# Patient Record
Sex: Female | Born: 1945 | Race: White | Hispanic: No | Smoking: Never smoker
Health system: Southern US, Community
[De-identification: ages and names within clinical notes are randomized; demographics above are authoritative.]

---

## 2008-05-08 ENCOUNTER — Emergency Department (HOSPITAL_COMMUNITY): Admission: EM | Admit: 2008-05-08 | Discharge: 2008-05-08 | Payer: Self-pay | Admitting: Emergency Medicine

## 2009-02-15 ENCOUNTER — Emergency Department (HOSPITAL_COMMUNITY): Admission: EM | Admit: 2009-02-15 | Discharge: 2009-02-15 | Payer: Self-pay | Admitting: Family Medicine

## 2009-05-10 ENCOUNTER — Emergency Department (HOSPITAL_COMMUNITY): Admission: EM | Admit: 2009-05-10 | Discharge: 2009-05-10 | Payer: Self-pay | Admitting: Emergency Medicine

## 2010-03-09 ENCOUNTER — Encounter: Admission: RE | Admit: 2010-03-09 | Discharge: 2010-03-09 | Payer: Self-pay | Admitting: Orthopedic Surgery

## 2011-01-08 LAB — POCT URINALYSIS DIP (DEVICE): Urobilinogen, UA: 0.2 mg/dL (ref 0.0–1.0)

## 2011-01-11 LAB — POCT URINALYSIS DIP (DEVICE)
Ketones, ur: NEGATIVE mg/dL
Protein, ur: NEGATIVE mg/dL
Specific Gravity, Urine: <=1.005 (ref 1.005–1.030)
Urobilinogen, UA: 0.2 mg/dL (ref 0.0–1.0)
pH: 7 (ref 5.0–8.0)

## 2011-07-01 LAB — POCT URINALYSIS DIP (DEVICE)
Bilirubin Urine: NEGATIVE
Glucose, UA: NEGATIVE
Ketones, ur: NEGATIVE
Nitrite: NEGATIVE
Operator id: 239701
Protein, ur: NEGATIVE
Specific Gravity, Urine: 1.01
Urobilinogen, UA: 0.2

## 2011-07-01 LAB — URINE CULTURE: Colony Count: >=100000

## 2015-04-13 DIAGNOSIS — S91312A Laceration without foreign body, left foot, initial encounter: Secondary | ICD-10-CM | POA: Diagnosis not present

## 2015-04-20 ENCOUNTER — Ambulatory Visit
Admission: RE | Admit: 2015-04-20 | Discharge: 2015-04-20 | Disposition: A | Discharge status: Home / Self Care | Payer: Medicare PPO | Source: Ambulatory Visit | Attending: Internal Medicine | Admitting: Internal Medicine

## 2015-04-20 ENCOUNTER — Other Ambulatory Visit: Payer: Self-pay | Admitting: Internal Medicine

## 2015-04-20 DIAGNOSIS — Z9181 History of falling: Secondary | ICD-10-CM | POA: Diagnosis not present

## 2015-04-20 DIAGNOSIS — R0789 Other chest pain: Secondary | ICD-10-CM

## 2015-04-20 DIAGNOSIS — T148 Other injury of unspecified body region: Secondary | ICD-10-CM | POA: Diagnosis not present

## 2015-04-20 DIAGNOSIS — R079 Chest pain, unspecified: Secondary | ICD-10-CM | POA: Diagnosis not present

## 2015-04-20 DIAGNOSIS — S8392XS Sprain of unspecified site of left knee, sequela: Secondary | ICD-10-CM | POA: Diagnosis not present

## 2015-04-20 DIAGNOSIS — S299XXA Unspecified injury of thorax, initial encounter: Secondary | ICD-10-CM | POA: Diagnosis not present

## 2015-04-27 DIAGNOSIS — S91312D Laceration without foreign body, left foot, subsequent encounter: Secondary | ICD-10-CM | POA: Diagnosis not present

## 2015-04-27 DIAGNOSIS — S93492A Sprain of other ligament of left ankle, initial encounter: Secondary | ICD-10-CM | POA: Diagnosis not present

## 2015-05-05 DIAGNOSIS — H2513 Age-related nuclear cataract, bilateral: Secondary | ICD-10-CM | POA: Diagnosis not present

## 2015-05-05 DIAGNOSIS — H3561 Retinal hemorrhage, right eye: Secondary | ICD-10-CM | POA: Diagnosis not present

## 2015-05-05 DIAGNOSIS — H538 Other visual disturbances: Secondary | ICD-10-CM | POA: Diagnosis not present

## 2015-05-26 DIAGNOSIS — H538 Other visual disturbances: Secondary | ICD-10-CM | POA: Diagnosis not present

## 2015-05-26 DIAGNOSIS — H3561 Retinal hemorrhage, right eye: Secondary | ICD-10-CM | POA: Diagnosis not present

## 2015-05-26 DIAGNOSIS — H2513 Age-related nuclear cataract, bilateral: Secondary | ICD-10-CM | POA: Diagnosis not present

## 2015-07-06 DIAGNOSIS — L821 Other seborrheic keratosis: Secondary | ICD-10-CM | POA: Diagnosis not present

## 2015-07-06 DIAGNOSIS — D225 Melanocytic nevi of trunk: Secondary | ICD-10-CM | POA: Diagnosis not present

## 2015-07-06 DIAGNOSIS — Z85828 Personal history of other malignant neoplasm of skin: Secondary | ICD-10-CM | POA: Diagnosis not present

## 2015-07-14 DIAGNOSIS — R636 Underweight: Secondary | ICD-10-CM | POA: Diagnosis not present

## 2015-07-14 DIAGNOSIS — Z0000 Encounter for general adult medical examination without abnormal findings: Secondary | ICD-10-CM | POA: Diagnosis not present

## 2015-07-14 DIAGNOSIS — Z1389 Encounter for screening for other disorder: Secondary | ICD-10-CM | POA: Diagnosis not present

## 2016-08-12 ENCOUNTER — Other Ambulatory Visit: Payer: Self-pay | Admitting: Internal Medicine

## 2016-08-12 ENCOUNTER — Ambulatory Visit
Admission: RE | Admit: 2016-08-12 | Discharge: 2016-08-12 | Disposition: A | Discharge status: Home / Self Care | Payer: Medicare PPO | Source: Ambulatory Visit | Attending: Internal Medicine | Admitting: Internal Medicine

## 2016-08-12 DIAGNOSIS — M1712 Unilateral primary osteoarthritis, left knee: Secondary | ICD-10-CM

## 2020-02-19 ENCOUNTER — Other Ambulatory Visit: Payer: Self-pay

## 2020-02-19 ENCOUNTER — Ambulatory Visit: Payer: Medicare PPO | Admitting: Cardiovascular Disease

## 2020-02-19 ENCOUNTER — Encounter: Payer: Self-pay | Admitting: Cardiovascular Disease

## 2020-02-19 DIAGNOSIS — R0600 Dyspnea, unspecified: Secondary | ICD-10-CM | POA: Diagnosis not present

## 2020-02-19 DIAGNOSIS — R9431 Abnormal electrocardiogram [ECG] [EKG]: Secondary | ICD-10-CM | POA: Diagnosis not present

## 2020-02-19 DIAGNOSIS — R0989 Other specified symptoms and signs involving the circulatory and respiratory systems: Secondary | ICD-10-CM

## 2020-02-19 DIAGNOSIS — R0609 Other forms of dyspnea: Secondary | ICD-10-CM | POA: Insufficient documentation

## 2020-02-19 DIAGNOSIS — I4510 Unspecified right bundle-branch block: Secondary | ICD-10-CM | POA: Diagnosis not present

## 2020-02-19 NOTE — Progress Notes (Signed)
02/19/2020 Glenda Black   [birth year]  [id number]  Primary Physician Leeroy Cha, MD Primary Cardiologist: Lorretta Harp MD FACP, [place], [place], [place]  HPI:  Glenda Black is a 74 y.o. is a thin and fit appearing widowed (unfortunately has been a 5 years died in 11-11-22 of this year) Caucasian female mother of 3 children (2 daughters, 1 son, grandmother of 2 grandchildren referred by Dr.Varadarajan for cardiovascular dilation because of the abnormal EKG and dyspnea.  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  She has no cardiac risk factors.  She is never had a heart attack or stroke.  She denies chest pain.  She is noticed new onset dyspnea over the last several months.  She is fairly active and walks 20 minutes a day in the mornings.  She does have a right bundle branch block.  Her most recent lipid profile performed 07/19/2017 reveals a cholesterol 170 with an LDL of 91.   Current Meds  Medication Sig  . albuterol (VENTOLIN HFA) 108 (90 Base) MCG/ACT inhaler   . metroNIDAZOLE (METROCREAM) 0.75 % cream      Allergies  Allergen Reactions  . Latex Rash  . Tetracycline Rash    Social History   Socioeconomic History  . Marital status: Married    Spouse name: Not on file  . Number of children: Not on file  . Years of education: Not on file  . Highest education level: Not on file  Occupational History  . Not on file  Tobacco Use  . Smoking status: Never Smoker  . Smokeless tobacco: Never Used  Substance and Sexual Activity  . Alcohol use: Never  . Drug use: Never  . Sexual activity: Not on file  Other Topics Concern  . Not on file  Social History Narrative  . Not on file   Social Determinants of Health   Financial Resource Strain:   . Difficulty of Paying Living Expenses:   Food Insecurity:   . Worried About Charity fundraiser in the Last Year:   . Arboriculturist in the Last Year:    Transportation Needs:   . Film/video editor (Medical):   Marland Kitchen Lack of Transportation (Non-Medical):   Physical Activity:   . Days of Exercise per Week:   . Minutes of Exercise per Session:   Stress:   . Feeling of Stress :   Social Connections:   . Frequency of Communication with Friends and Family:   . Frequency of Social Gatherings with Friends and Family:   . Attends Religious Services:   . Active Member of Clubs or Organizations:   . Attends Archivist Meetings:   Marland Kitchen Marital Status:   Intimate Partner Violence:   . Fear of Current or Ex-Partner:   . Emotionally Abused:   Marland Kitchen Physically Abused:   . Sexually Abused:      Review of Systems: General: negative for chills, fever, night sweats or weight changes.  Cardiovascular: negative for chest pain, dyspnea on exertion, edema, orthopnea, palpitations, paroxysmal nocturnal dyspnea or shortness of breath Dermatological: negative for rash Respiratory: negative for cough or wheezing Urologic: negative for hematuria Abdominal: negative for nausea, vomiting, diarrhea, bright red blood per rectum, melena, or hematemesis Neurologic: negative for visual changes, syncope, or dizziness All other systems reviewed and are otherwise negative except as noted above.    Blood pressure 114/72, pulse 82, height 5\' 8"  (1.727 m), weight 135  lb 9.6 oz (61.5 kg), SpO2 98 %.  General appearance: alert and no distress Neck: no adenopathy, no JVD, supple, symmetrical, trachea midline, thyroid not enlarged, symmetric, no tenderness/mass/nodules and Soft left carotid bruit Lungs: clear to auscultation bilaterally Heart: regular rate and rhythm, S1, S2 normal, no murmur, click, rub or gallop Extremities: extremities normal, atraumatic, no cyanosis or edema Pulses: 2+ and symmetric Skin: Skin color, texture, turgor normal. No rashes or lesions Neurologic: Alert and oriented X 3, normal strength and tone. Normal symmetric reflexes. Normal  coordination and gait  EKG sinus rhythm at 82 with right bundle branch block.  I personally reviewed this EKG.  ASSESSMENT AND PLAN:   Right bundle branch block Unknown duration  Dyspnea on exertion Fairly new onset dyspnea on exertion.  Patient has no history of tobacco abuse or exposure to lung toxins.  She denies chest pain.  I am going to get a coronary calcium score and a 2D echo to further evaluate  Left carotid bruit Left carotid bruit on physical exam today.  Will check carotid Dopplers to further evaluate      Runell Gess MD [facility], [facility] 02/19/2020 10:50 AM

## 2020-02-19 NOTE — Assessment & Plan Note (Signed)
Unknown duration 

## 2020-02-19 NOTE — Assessment & Plan Note (Signed)
Fairly new onset dyspnea on exertion.  Patient has no history of tobacco abuse or exposure to lung toxins.  She denies chest pain.  I am going to get a coronary calcium score and a 2D echo to further evaluate

## 2020-02-19 NOTE — Patient Instructions (Signed)
Medication Instructions:  The current medical regimen is effective;  continue present plan and medications.  *If you need a refill on your cardiac medications before your next appointment, please call your pharmacy*   Testing/Procedures: Your physician has requested that you have a carotid duplex. This test is an ultrasound of the carotid arteries in your neck. It looks at blood flow through these arteries that supply the brain with blood. Allow one hour for this exam. There are no restrictions or special instructions.  Echocardiogram - Your physician has requested that you have an echocardiogram. Echocardiography is a painless test that uses sound waves to create images of your heart. It provides your doctor with information about the size and shape of your heart and how well your heart's chambers and valves are working. This procedure takes approximately one hour. There are no restrictions for this procedure. This will be performed at our [facility] location - [address].  CALCIUM SCORE   Follow-Up: At [facility], you and your health needs are our priority.  As part of our continuing mission to provide you with exceptional heart care, we have created designated Provider Care Teams.  These Care Teams include your primary Cardiologist (physician) and Advanced Practice Providers (APPs -  Physician Assistants and Nurse Practitioners) who all work together to provide you with the care you need, when you need it.  We recommend signing up for the patient portal called "MyChart".  Sign up information is provided on this After Visit Summary.  MyChart is used to connect with patients for Virtual Visits (Telemedicine).  Patients are able to view lab/test results, encounter notes, upcoming appointments, etc.  Non-urgent messages can be sent to your provider as well.   To learn more about what you can do with MyChart, go to [email or web address].    Your next appointment:   4  week(s)  The format for your next appointment:   In Person  Provider:   Nanetta Batty, MD

## 2020-02-19 NOTE — Assessment & Plan Note (Signed)
Left carotid bruit on physical exam today.  Will check carotid Dopplers to further evaluate

## 2020-02-21 ENCOUNTER — Other Ambulatory Visit: Payer: Self-pay

## 2020-02-21 ENCOUNTER — Ambulatory Visit (HOSPITAL_COMMUNITY)
Admission: RE | Admit: 2020-02-21 | Discharge: 2020-02-21 | Disposition: A | Discharge status: Home / Self Care | Payer: Medicare PPO | Source: Ambulatory Visit | Attending: Internal Medicine | Admitting: Internal Medicine

## 2020-02-21 DIAGNOSIS — R0989 Other specified symptoms and signs involving the circulatory and respiratory systems: Secondary | ICD-10-CM | POA: Diagnosis not present

## 2020-03-13 ENCOUNTER — Ambulatory Visit (INDEPENDENT_AMBULATORY_CARE_PROVIDER_SITE_OTHER)
Admission: RE | Admit: 2020-03-13 | Discharge: 2020-03-13 | Disposition: A | Discharge status: Home / Self Care | Payer: Medicare PPO | Source: Ambulatory Visit | Attending: Cardiovascular Disease | Admitting: Cardiovascular Disease

## 2020-03-13 ENCOUNTER — Ambulatory Visit (HOSPITAL_COMMUNITY): Payer: Medicare PPO | Attending: Cardiology

## 2020-03-13 ENCOUNTER — Other Ambulatory Visit: Payer: Self-pay

## 2020-03-13 DIAGNOSIS — R0609 Other forms of dyspnea: Secondary | ICD-10-CM

## 2020-03-13 DIAGNOSIS — R0600 Dyspnea, unspecified: Secondary | ICD-10-CM

## 2020-03-13 DIAGNOSIS — R9431 Abnormal electrocardiogram [ECG] [EKG]: Secondary | ICD-10-CM | POA: Diagnosis present

## 2020-03-17 ENCOUNTER — Telehealth: Payer: Self-pay | Admitting: Cardiovascular Disease

## 2020-03-17 NOTE — Telephone Encounter (Signed)
LM that results were sent to mychart previously. OV 03/24/20 to review

## 2020-03-17 NOTE — Telephone Encounter (Signed)
° ° °  Pt returning call to get her CT and echo results.

## 2020-03-19 ENCOUNTER — Telehealth: Payer: Self-pay | Admitting: Cardiovascular Disease

## 2020-03-19 NOTE — Telephone Encounter (Signed)
Patient states that her results for her CT and echo came back normal. She wants to know if the appt on 03/24/20 with Dr. Allyson Sabal is necessary

## 2020-03-19 NOTE — Telephone Encounter (Signed)
Patient's CT calcium score, echo, carotid were WNL. No additional work up was advised on test results. Patient wishes to cancel 6/22 visit.

## 2020-03-24 ENCOUNTER — Ambulatory Visit: Payer: Medicare PPO | Admitting: Cardiovascular Disease

## 2020-04-16 DIAGNOSIS — L719 Rosacea, unspecified: Secondary | ICD-10-CM | POA: Diagnosis not present

## 2020-04-16 DIAGNOSIS — D0439 Carcinoma in situ of skin of other parts of face: Secondary | ICD-10-CM | POA: Diagnosis not present

## 2020-05-06 DIAGNOSIS — L570 Actinic keratosis: Secondary | ICD-10-CM | POA: Diagnosis not present

## 2020-05-06 DIAGNOSIS — Z08 Encounter for follow-up examination after completed treatment for malignant neoplasm: Secondary | ICD-10-CM | POA: Diagnosis not present

## 2020-05-06 DIAGNOSIS — L718 Other rosacea: Secondary | ICD-10-CM | POA: Diagnosis not present

## 2020-05-06 DIAGNOSIS — X32XXXA Exposure to sunlight, initial encounter: Secondary | ICD-10-CM | POA: Diagnosis not present

## 2020-05-06 DIAGNOSIS — Z85828 Personal history of other malignant neoplasm of skin: Secondary | ICD-10-CM | POA: Diagnosis not present

## 2020-05-07 DIAGNOSIS — J302 Other seasonal allergic rhinitis: Secondary | ICD-10-CM | POA: Diagnosis not present

## 2020-05-07 DIAGNOSIS — G43109 Migraine with aura, not intractable, without status migrainosus: Secondary | ICD-10-CM | POA: Diagnosis not present

## 2020-05-07 DIAGNOSIS — B181 Chronic viral hepatitis B without delta-agent: Secondary | ICD-10-CM | POA: Diagnosis not present

## 2020-07-09 DIAGNOSIS — Z23 Encounter for immunization: Secondary | ICD-10-CM | POA: Diagnosis not present

## 2020-07-18 ENCOUNTER — Ambulatory Visit: Payer: Medicare PPO

## 2020-08-01 ENCOUNTER — Other Ambulatory Visit: Payer: Self-pay

## 2020-08-01 ENCOUNTER — Ambulatory Visit: Payer: Medicare PPO | Attending: Internal Medicine

## 2020-08-01 DIAGNOSIS — Z23 Encounter for immunization: Secondary | ICD-10-CM

## 2020-08-01 NOTE — Progress Notes (Signed)
° °  Covid-19 Vaccination Clinic  Name:  Glenda Black    MRN: [id number] DOB: Jan 24, 1946  08/01/2020  [name] was observed post Covid-19 immunization for 15 minutes without incident. She was provided with Vaccine Information Sheet and instruction to access the V-Safe system.   [name] was instructed to call 911 with any severe reactions post vaccine:  Difficulty breathing   Swelling of face and throat   A fast heartbeat   A bad rash all over body   Dizziness and weakness

## 2020-08-20 DIAGNOSIS — L578 Other skin changes due to chronic exposure to nonionizing radiation: Secondary | ICD-10-CM | POA: Diagnosis not present

## 2020-08-20 DIAGNOSIS — L814 Other melanin hyperpigmentation: Secondary | ICD-10-CM | POA: Diagnosis not present

## 2020-08-20 DIAGNOSIS — L905 Scar conditions and fibrosis of skin: Secondary | ICD-10-CM | POA: Diagnosis not present

## 2020-08-20 DIAGNOSIS — L570 Actinic keratosis: Secondary | ICD-10-CM | POA: Diagnosis not present

## 2020-08-20 DIAGNOSIS — L821 Other seborrheic keratosis: Secondary | ICD-10-CM | POA: Diagnosis not present

## 2020-08-20 DIAGNOSIS — Z85828 Personal history of other malignant neoplasm of skin: Secondary | ICD-10-CM | POA: Diagnosis not present

## 2020-08-20 DIAGNOSIS — D225 Melanocytic nevi of trunk: Secondary | ICD-10-CM | POA: Diagnosis not present

## 2020-10-05 DIAGNOSIS — L820 Inflamed seborrheic keratosis: Secondary | ICD-10-CM | POA: Diagnosis not present

## 2020-10-05 DIAGNOSIS — D485 Neoplasm of uncertain behavior of skin: Secondary | ICD-10-CM | POA: Diagnosis not present

## 2020-10-05 DIAGNOSIS — L821 Other seborrheic keratosis: Secondary | ICD-10-CM | POA: Diagnosis not present

## 2020-10-14 IMAGING — CT CT CARDIAC CORONARY ARTERY CALCIUM SCORE
3 series · 14 of 20 positions shown, 15 images · non-contrast
Comparison: None.

Addendum:
CLINICAL DATA: Risk stratification

EXAM:
Coronary Calcium Score
TECHNIQUE: The patient was scanned on a Siemens Force scanner. Axial
non-contrast 3 mm slices were carried out through the heart. The
data set was analyzed on a dedicated work station and scored using
the Agatson method.

[Series 2: casc 3.0 bv41 2 bestdiast 73 % · axial · 0.31mm/px · z∈[-210,-120]mm · 4 of 52 slices shown, 5 images]
[im 11/52  vessel]
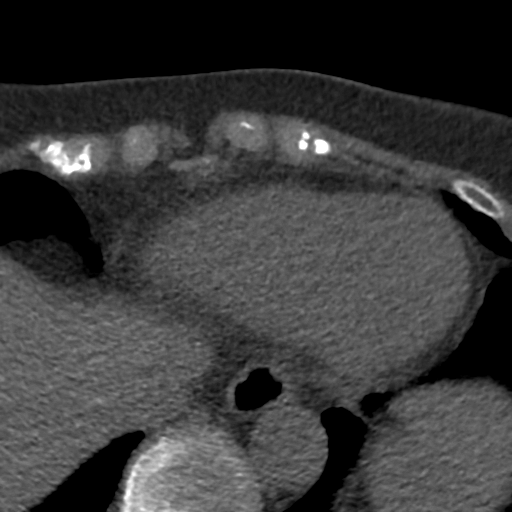
[im 11/52  lung]
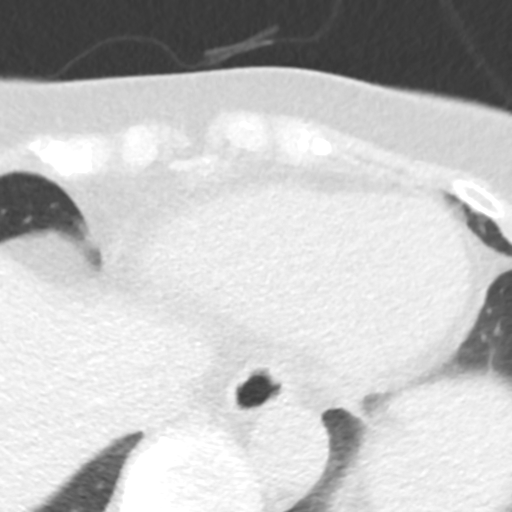
[im 21/52  vessel]
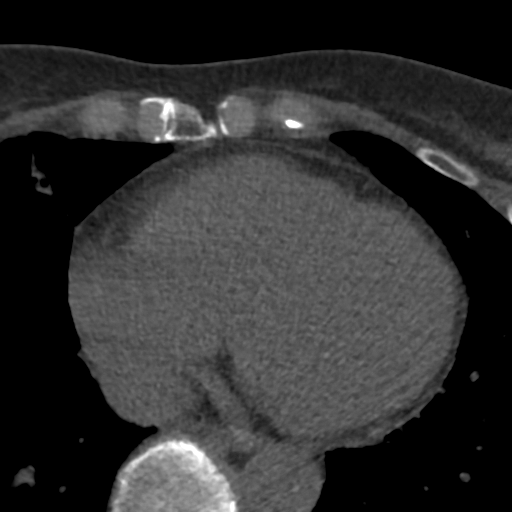
[im 31/52  vessel]
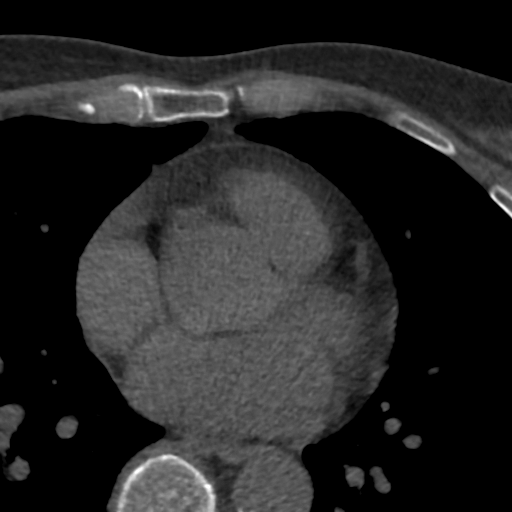
[im 41/52  vessel]
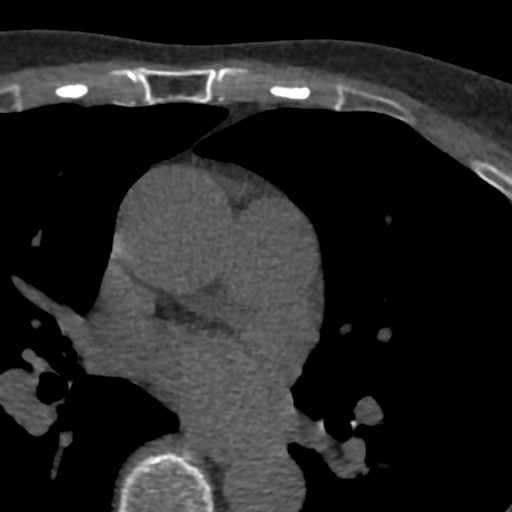

[Series 3: lung 73 % · axial · 0.68mm/px · z∈[-216,-114]mm · 5 of 52 slices shown]
[im 9/52  lung]
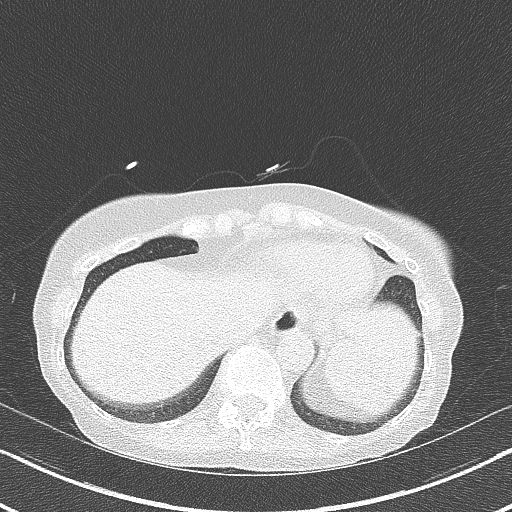
[im 18/52  lung]
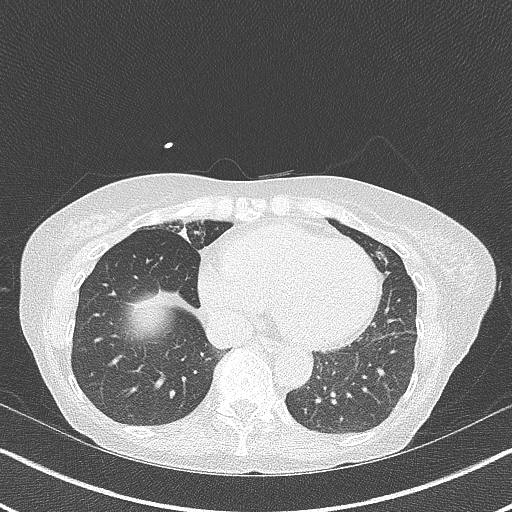
[im 26/52  lung]
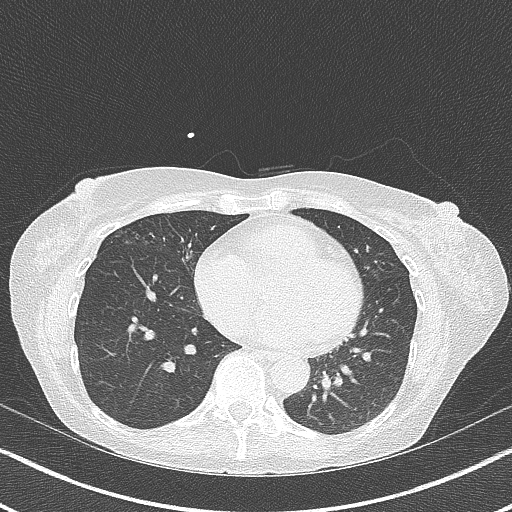
[im 35/52  lung]
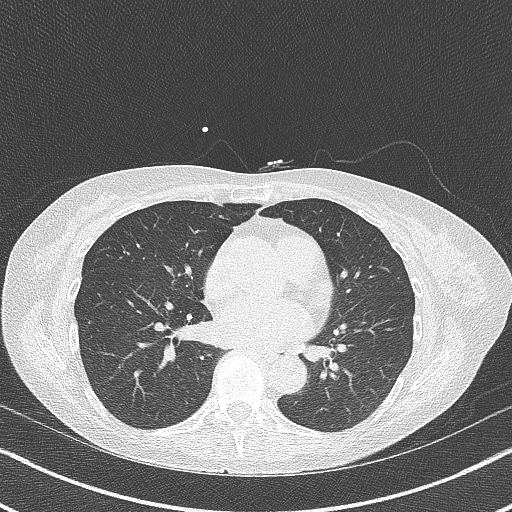
[im 43/52  lung]
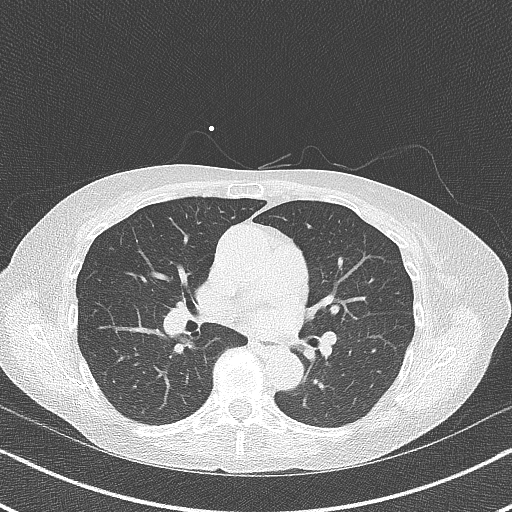

[Series 4: lung st 73 % · axial · 0.68mm/px · z∈[-216,-114]mm · 5 of 52 slices shown]
[im 9/52  lung]
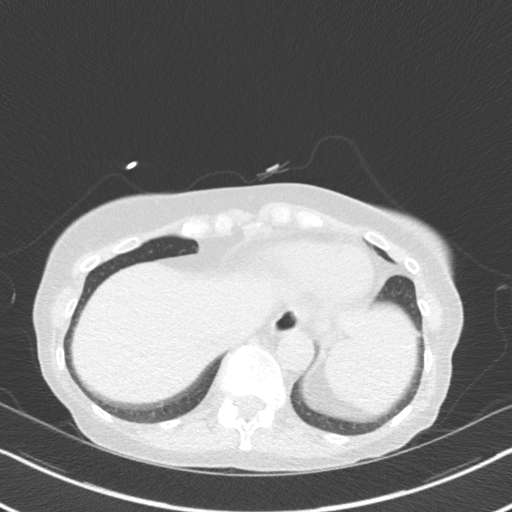
[im 18/52  lung]
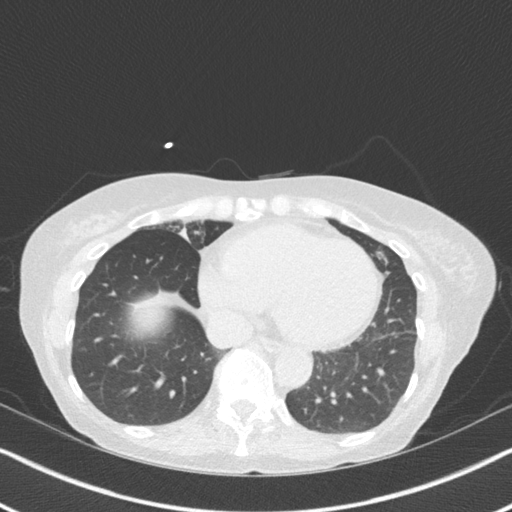
[im 26/52  lung]
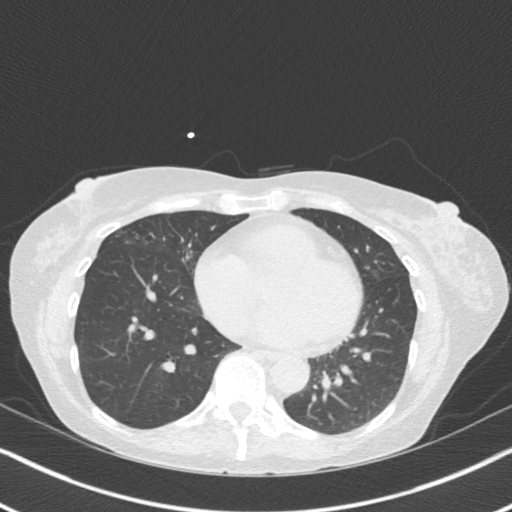
[im 35/52  lung]
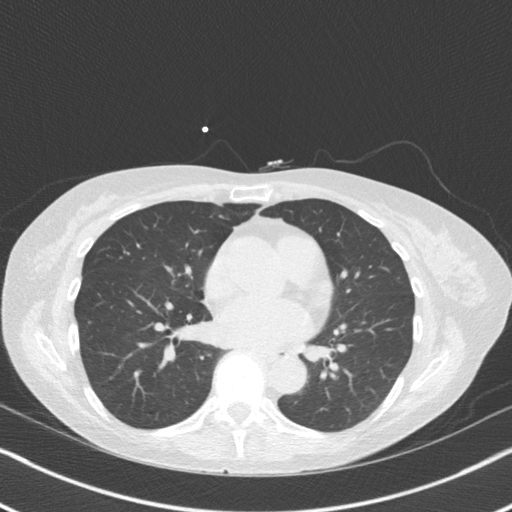
[im 43/52  lung]
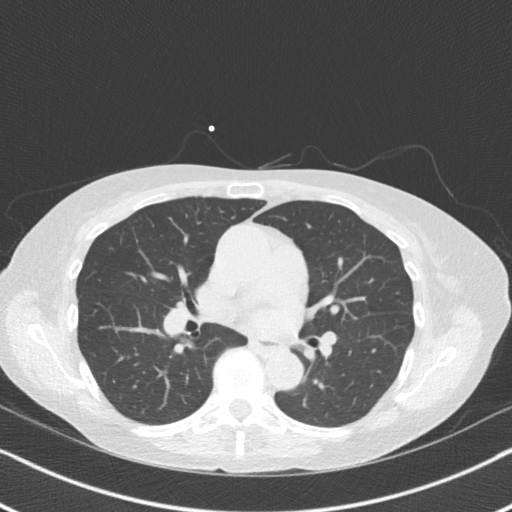

[14 of 20 positions shown; findings below may reference images not displayed]

FINDINGS: Non-cardiac: See separate report from [REDACTED].

Ascending Aorta: Normal caliber.  Aortic atherosclerosis.

Pericardium: Normal

Coronary arteries: Normal origins
IMPRESSION: Coronary calcium score of 0.

Aortic atherosclerosis.

EXAM:
OVER-READ INTERPRETATION  CT CHEST

The following report is an over-read performed by radiologist Dr.
over-read does not include interpretation of cardiac or coronary
anatomy or pathology. The coronary calcium score interpretation by
the cardiologist is attached.
FINDINGS: Aortic atherosclerosis. In the right middle lobe there is some mild
cylindrical bronchiectasis, thickening of the peribronchovascular
interstitium with regional architectural distortion and some
peribronchovascular micronodularity most compatible with areas of
mucoid impaction within terminal bronchioles. Within the visualized
portions of the thorax there are no this other larger more
suspicious appearing pulmonary nodules or masses, there is no acute
consolidative airspace disease, no pleural effusions, no
pneumothorax and no lymphadenopathy. Visualized portions of the
upper abdomen are unremarkable. There are no aggressive appearing
lytic or blastic lesions noted in the visualized portions of the
skeleton.
IMPRESSION: 1.  Aortic Atherosclerosis ([id number]).
2. Chronic post infectious changes in the right middle lobe, as
detailed above.

*** End of Addendum ***
FINDINGS: Non-cardiac: See separate report from [REDACTED].

Ascending Aorta: Normal caliber.  Aortic atherosclerosis.

Pericardium: Normal

Coronary arteries: Normal origins
IMPRESSION: Coronary calcium score of 0.

Aortic atherosclerosis.

## 2020-12-08 DIAGNOSIS — Z1231 Encounter for screening mammogram for malignant neoplasm of breast: Secondary | ICD-10-CM | POA: Diagnosis not present

## 2021-01-05 DIAGNOSIS — L570 Actinic keratosis: Secondary | ICD-10-CM | POA: Diagnosis not present

## 2021-02-03 DIAGNOSIS — Z136 Encounter for screening for cardiovascular disorders: Secondary | ICD-10-CM | POA: Diagnosis not present

## 2021-02-03 DIAGNOSIS — M810 Age-related osteoporosis without current pathological fracture: Secondary | ICD-10-CM | POA: Diagnosis not present

## 2021-02-03 DIAGNOSIS — Z1211 Encounter for screening for malignant neoplasm of colon: Secondary | ICD-10-CM | POA: Diagnosis not present

## 2021-02-03 DIAGNOSIS — Z0000 Encounter for general adult medical examination without abnormal findings: Secondary | ICD-10-CM | POA: Diagnosis not present

## 2021-02-03 DIAGNOSIS — F4323 Adjustment disorder with mixed anxiety and depressed mood: Secondary | ICD-10-CM | POA: Diagnosis not present

## 2021-02-03 DIAGNOSIS — B181 Chronic viral hepatitis B without delta-agent: Secondary | ICD-10-CM | POA: Diagnosis not present

## 2021-02-03 DIAGNOSIS — J302 Other seasonal allergic rhinitis: Secondary | ICD-10-CM | POA: Diagnosis not present

## 2021-02-03 DIAGNOSIS — M170 Bilateral primary osteoarthritis of knee: Secondary | ICD-10-CM | POA: Diagnosis not present

## 2021-02-03 DIAGNOSIS — G43109 Migraine with aura, not intractable, without status migrainosus: Secondary | ICD-10-CM | POA: Diagnosis not present

## 2021-02-03 DIAGNOSIS — Z1389 Encounter for screening for other disorder: Secondary | ICD-10-CM | POA: Diagnosis not present

## 2021-02-03 DIAGNOSIS — Z1231 Encounter for screening mammogram for malignant neoplasm of breast: Secondary | ICD-10-CM | POA: Diagnosis not present

## 2021-02-25 DIAGNOSIS — Z1211 Encounter for screening for malignant neoplasm of colon: Secondary | ICD-10-CM | POA: Diagnosis not present

## 2021-02-25 DIAGNOSIS — Z1212 Encounter for screening for malignant neoplasm of rectum: Secondary | ICD-10-CM | POA: Diagnosis not present

## 2021-03-04 DIAGNOSIS — M722 Plantar fascial fibromatosis: Secondary | ICD-10-CM | POA: Diagnosis not present

## 2021-03-04 DIAGNOSIS — M79671 Pain in right foot: Secondary | ICD-10-CM | POA: Diagnosis not present

## 2021-03-04 DIAGNOSIS — Q6671 Congenital pes cavus, right foot: Secondary | ICD-10-CM | POA: Diagnosis not present

## 2021-03-04 DIAGNOSIS — M79672 Pain in left foot: Secondary | ICD-10-CM | POA: Diagnosis not present

## 2021-03-04 DIAGNOSIS — Q6672 Congenital pes cavus, left foot: Secondary | ICD-10-CM | POA: Diagnosis not present

## 2021-03-04 LAB — COLOGUARD: COLOGUARD: NEGATIVE

## 2021-03-22 DIAGNOSIS — M722 Plantar fascial fibromatosis: Secondary | ICD-10-CM | POA: Diagnosis not present

## 2021-04-01 DIAGNOSIS — H01001 Unspecified blepharitis right upper eyelid: Secondary | ICD-10-CM | POA: Diagnosis not present

## 2021-04-01 DIAGNOSIS — H01004 Unspecified blepharitis left upper eyelid: Secondary | ICD-10-CM | POA: Diagnosis not present

## 2021-04-12 DIAGNOSIS — H0102B Squamous blepharitis left eye, upper and lower eyelids: Secondary | ICD-10-CM | POA: Diagnosis not present

## 2021-04-12 DIAGNOSIS — H0102A Squamous blepharitis right eye, upper and lower eyelids: Secondary | ICD-10-CM | POA: Diagnosis not present

## 2021-05-21 DIAGNOSIS — H6502 Acute serous otitis media, left ear: Secondary | ICD-10-CM | POA: Diagnosis not present

## 2021-05-26 DIAGNOSIS — M810 Age-related osteoporosis without current pathological fracture: Secondary | ICD-10-CM | POA: Diagnosis not present

## 2021-05-26 DIAGNOSIS — Z131 Encounter for screening for diabetes mellitus: Secondary | ICD-10-CM | POA: Diagnosis not present

## 2021-05-26 DIAGNOSIS — E785 Hyperlipidemia, unspecified: Secondary | ICD-10-CM | POA: Diagnosis not present

## 2021-05-26 DIAGNOSIS — R0981 Nasal congestion: Secondary | ICD-10-CM | POA: Diagnosis not present

## 2021-05-26 DIAGNOSIS — H9201 Otalgia, right ear: Secondary | ICD-10-CM | POA: Diagnosis not present

## 2021-07-30 DIAGNOSIS — D692 Other nonthrombocytopenic purpura: Secondary | ICD-10-CM | POA: Diagnosis not present

## 2021-07-30 DIAGNOSIS — L814 Other melanin hyperpigmentation: Secondary | ICD-10-CM | POA: Diagnosis not present

## 2021-07-30 DIAGNOSIS — Z85828 Personal history of other malignant neoplasm of skin: Secondary | ICD-10-CM | POA: Diagnosis not present

## 2021-07-30 DIAGNOSIS — L570 Actinic keratosis: Secondary | ICD-10-CM | POA: Diagnosis not present

## 2021-07-30 DIAGNOSIS — Z08 Encounter for follow-up examination after completed treatment for malignant neoplasm: Secondary | ICD-10-CM | POA: Diagnosis not present

## 2021-07-30 DIAGNOSIS — Z1283 Encounter for screening for malignant neoplasm of skin: Secondary | ICD-10-CM | POA: Diagnosis not present

## 2021-11-24 DIAGNOSIS — J301 Allergic rhinitis due to pollen: Secondary | ICD-10-CM | POA: Diagnosis not present

## 2021-11-24 DIAGNOSIS — R0981 Nasal congestion: Secondary | ICD-10-CM | POA: Diagnosis not present

## 2021-11-24 DIAGNOSIS — R42 Dizziness and giddiness: Secondary | ICD-10-CM | POA: Diagnosis not present

## 2022-06-09 DIAGNOSIS — D0439 Carcinoma in situ of skin of other parts of face: Secondary | ICD-10-CM | POA: Diagnosis not present

## 2022-06-09 DIAGNOSIS — D485 Neoplasm of uncertain behavior of skin: Secondary | ICD-10-CM | POA: Diagnosis not present

## 2022-06-09 DIAGNOSIS — L814 Other melanin hyperpigmentation: Secondary | ICD-10-CM | POA: Diagnosis not present

## 2022-06-09 DIAGNOSIS — L821 Other seborrheic keratosis: Secondary | ICD-10-CM | POA: Diagnosis not present

## 2022-06-09 DIAGNOSIS — C44519 Basal cell carcinoma of skin of other part of trunk: Secondary | ICD-10-CM | POA: Diagnosis not present

## 2022-06-09 DIAGNOSIS — D225 Melanocytic nevi of trunk: Secondary | ICD-10-CM | POA: Diagnosis not present

## 2022-06-09 DIAGNOSIS — Z1283 Encounter for screening for malignant neoplasm of skin: Secondary | ICD-10-CM | POA: Diagnosis not present

## 2022-06-09 DIAGNOSIS — D2271 Melanocytic nevi of right lower limb, including hip: Secondary | ICD-10-CM | POA: Diagnosis not present

## 2022-06-09 DIAGNOSIS — L820 Inflamed seborrheic keratosis: Secondary | ICD-10-CM | POA: Diagnosis not present

## 2022-07-18 DIAGNOSIS — E559 Vitamin D deficiency, unspecified: Secondary | ICD-10-CM | POA: Diagnosis not present

## 2022-07-18 DIAGNOSIS — Z131 Encounter for screening for diabetes mellitus: Secondary | ICD-10-CM | POA: Diagnosis not present

## 2022-07-18 DIAGNOSIS — M810 Age-related osteoporosis without current pathological fracture: Secondary | ICD-10-CM | POA: Diagnosis not present

## 2022-07-18 DIAGNOSIS — Z79899 Other long term (current) drug therapy: Secondary | ICD-10-CM | POA: Diagnosis not present

## 2022-07-18 DIAGNOSIS — B181 Chronic viral hepatitis B without delta-agent: Secondary | ICD-10-CM | POA: Diagnosis not present

## 2022-07-18 DIAGNOSIS — E785 Hyperlipidemia, unspecified: Secondary | ICD-10-CM | POA: Diagnosis not present

## 2022-07-18 DIAGNOSIS — C4490 Unspecified malignant neoplasm of skin, unspecified: Secondary | ICD-10-CM | POA: Diagnosis not present

## 2022-07-18 DIAGNOSIS — J301 Allergic rhinitis due to pollen: Secondary | ICD-10-CM | POA: Diagnosis not present

## 2022-07-18 DIAGNOSIS — Z0000 Encounter for general adult medical examination without abnormal findings: Secondary | ICD-10-CM | POA: Diagnosis not present

## 2022-07-20 DIAGNOSIS — M810 Age-related osteoporosis without current pathological fracture: Secondary | ICD-10-CM | POA: Diagnosis not present

## 2022-07-20 DIAGNOSIS — E785 Hyperlipidemia, unspecified: Secondary | ICD-10-CM | POA: Diagnosis not present

## 2022-07-20 DIAGNOSIS — Z79899 Other long term (current) drug therapy: Secondary | ICD-10-CM | POA: Diagnosis not present

## 2022-07-20 DIAGNOSIS — E559 Vitamin D deficiency, unspecified: Secondary | ICD-10-CM | POA: Diagnosis not present

## 2022-07-22 DIAGNOSIS — C44519 Basal cell carcinoma of skin of other part of trunk: Secondary | ICD-10-CM | POA: Diagnosis not present

## 2022-08-18 DIAGNOSIS — D0439 Carcinoma in situ of skin of other parts of face: Secondary | ICD-10-CM | POA: Diagnosis not present

## 2022-08-18 DIAGNOSIS — L570 Actinic keratosis: Secondary | ICD-10-CM | POA: Diagnosis not present

## 2022-12-08 DIAGNOSIS — L814 Other melanin hyperpigmentation: Secondary | ICD-10-CM | POA: Diagnosis not present

## 2022-12-08 DIAGNOSIS — Z1283 Encounter for screening for malignant neoplasm of skin: Secondary | ICD-10-CM | POA: Diagnosis not present

## 2022-12-08 DIAGNOSIS — L570 Actinic keratosis: Secondary | ICD-10-CM | POA: Diagnosis not present

## 2022-12-08 DIAGNOSIS — L820 Inflamed seborrheic keratosis: Secondary | ICD-10-CM | POA: Diagnosis not present

## 2022-12-08 DIAGNOSIS — D485 Neoplasm of uncertain behavior of skin: Secondary | ICD-10-CM | POA: Diagnosis not present

## 2022-12-08 DIAGNOSIS — L821 Other seborrheic keratosis: Secondary | ICD-10-CM | POA: Diagnosis not present

## 2022-12-08 DIAGNOSIS — D225 Melanocytic nevi of trunk: Secondary | ICD-10-CM | POA: Diagnosis not present

## 2023-02-01 DIAGNOSIS — L905 Scar conditions and fibrosis of skin: Secondary | ICD-10-CM | POA: Diagnosis not present

## 2023-02-01 DIAGNOSIS — L570 Actinic keratosis: Secondary | ICD-10-CM | POA: Diagnosis not present

## 2023-04-25 DIAGNOSIS — Z1231 Encounter for screening mammogram for malignant neoplasm of breast: Secondary | ICD-10-CM | POA: Diagnosis not present

## 2023-05-24 DIAGNOSIS — L570 Actinic keratosis: Secondary | ICD-10-CM | POA: Diagnosis not present

## 2023-07-20 DIAGNOSIS — C4490 Unspecified malignant neoplasm of skin, unspecified: Secondary | ICD-10-CM | POA: Diagnosis not present

## 2023-07-20 DIAGNOSIS — Z131 Encounter for screening for diabetes mellitus: Secondary | ICD-10-CM | POA: Diagnosis not present

## 2023-07-20 DIAGNOSIS — E559 Vitamin D deficiency, unspecified: Secondary | ICD-10-CM | POA: Diagnosis not present

## 2023-07-20 DIAGNOSIS — Z0000 Encounter for general adult medical examination without abnormal findings: Secondary | ICD-10-CM | POA: Diagnosis not present

## 2023-07-20 DIAGNOSIS — M810 Age-related osteoporosis without current pathological fracture: Secondary | ICD-10-CM | POA: Diagnosis not present

## 2023-07-20 DIAGNOSIS — I4510 Unspecified right bundle-branch block: Secondary | ICD-10-CM | POA: Diagnosis not present

## 2023-07-20 DIAGNOSIS — Z0184 Encounter for antibody response examination: Secondary | ICD-10-CM | POA: Diagnosis not present

## 2023-07-20 DIAGNOSIS — E785 Hyperlipidemia, unspecified: Secondary | ICD-10-CM | POA: Diagnosis not present

## 2023-07-20 DIAGNOSIS — B181 Chronic viral hepatitis B without delta-agent: Secondary | ICD-10-CM | POA: Diagnosis not present

## 2023-09-04 DIAGNOSIS — L259 Unspecified contact dermatitis, unspecified cause: Secondary | ICD-10-CM | POA: Diagnosis not present

## 2023-09-11 DIAGNOSIS — L259 Unspecified contact dermatitis, unspecified cause: Secondary | ICD-10-CM | POA: Diagnosis not present

## 2023-12-14 DIAGNOSIS — D225 Melanocytic nevi of trunk: Secondary | ICD-10-CM | POA: Diagnosis not present

## 2023-12-14 DIAGNOSIS — D235 Other benign neoplasm of skin of trunk: Secondary | ICD-10-CM | POA: Diagnosis not present

## 2023-12-14 DIAGNOSIS — L820 Inflamed seborrheic keratosis: Secondary | ICD-10-CM | POA: Diagnosis not present

## 2023-12-14 DIAGNOSIS — Z08 Encounter for follow-up examination after completed treatment for malignant neoplasm: Secondary | ICD-10-CM | POA: Diagnosis not present

## 2023-12-14 DIAGNOSIS — Z85828 Personal history of other malignant neoplasm of skin: Secondary | ICD-10-CM | POA: Diagnosis not present

## 2023-12-14 DIAGNOSIS — D485 Neoplasm of uncertain behavior of skin: Secondary | ICD-10-CM | POA: Diagnosis not present

## 2023-12-14 DIAGNOSIS — L814 Other melanin hyperpigmentation: Secondary | ICD-10-CM | POA: Diagnosis not present

## 2024-05-05 DIAGNOSIS — Z1211 Encounter for screening for malignant neoplasm of colon: Secondary | ICD-10-CM | POA: Diagnosis not present

## 2024-05-15 DIAGNOSIS — T7840XD Allergy, unspecified, subsequent encounter: Secondary | ICD-10-CM | POA: Diagnosis not present

## 2024-06-04 DIAGNOSIS — J302 Other seasonal allergic rhinitis: Secondary | ICD-10-CM | POA: Diagnosis not present

## 2024-06-04 DIAGNOSIS — J3089 Other allergic rhinitis: Secondary | ICD-10-CM | POA: Diagnosis not present

## 2024-06-04 DIAGNOSIS — L299 Pruritus, unspecified: Secondary | ICD-10-CM | POA: Diagnosis not present

## 2024-06-04 DIAGNOSIS — R0981 Nasal congestion: Secondary | ICD-10-CM | POA: Diagnosis not present

## 2024-06-13 DIAGNOSIS — L299 Pruritus, unspecified: Secondary | ICD-10-CM | POA: Diagnosis not present

## 2024-06-13 DIAGNOSIS — L272 Dermatitis due to ingested food: Secondary | ICD-10-CM | POA: Diagnosis not present

## 2024-06-13 DIAGNOSIS — J302 Other seasonal allergic rhinitis: Secondary | ICD-10-CM | POA: Diagnosis not present

## 2024-06-13 DIAGNOSIS — J3089 Other allergic rhinitis: Secondary | ICD-10-CM | POA: Diagnosis not present

## 2024-06-19 DIAGNOSIS — Z09 Encounter for follow-up examination after completed treatment for conditions other than malignant neoplasm: Secondary | ICD-10-CM | POA: Diagnosis not present

## 2024-06-19 DIAGNOSIS — L2989 Other pruritus: Secondary | ICD-10-CM | POA: Diagnosis not present

## 2024-06-19 DIAGNOSIS — D225 Melanocytic nevi of trunk: Secondary | ICD-10-CM | POA: Diagnosis not present

## 2024-06-19 DIAGNOSIS — Z872 Personal history of diseases of the skin and subcutaneous tissue: Secondary | ICD-10-CM | POA: Diagnosis not present

## 2024-06-19 DIAGNOSIS — L814 Other melanin hyperpigmentation: Secondary | ICD-10-CM | POA: Diagnosis not present

## 2024-07-25 DIAGNOSIS — L2989 Other pruritus: Secondary | ICD-10-CM | POA: Diagnosis not present

## 2024-08-09 DIAGNOSIS — Z79899 Other long term (current) drug therapy: Secondary | ICD-10-CM | POA: Diagnosis not present

## 2024-08-09 DIAGNOSIS — E559 Vitamin D deficiency, unspecified: Secondary | ICD-10-CM | POA: Diagnosis not present

## 2024-08-09 DIAGNOSIS — E785 Hyperlipidemia, unspecified: Secondary | ICD-10-CM | POA: Diagnosis not present

## 2024-08-09 DIAGNOSIS — M810 Age-related osteoporosis without current pathological fracture: Secondary | ICD-10-CM | POA: Diagnosis not present

## 2024-08-09 DIAGNOSIS — Z131 Encounter for screening for diabetes mellitus: Secondary | ICD-10-CM | POA: Diagnosis not present

## 2024-08-09 DIAGNOSIS — B181 Chronic viral hepatitis B without delta-agent: Secondary | ICD-10-CM | POA: Diagnosis not present

## 2024-08-12 DIAGNOSIS — M810 Age-related osteoporosis without current pathological fracture: Secondary | ICD-10-CM | POA: Diagnosis not present

## 2024-08-12 DIAGNOSIS — R202 Paresthesia of skin: Secondary | ICD-10-CM | POA: Diagnosis not present

## 2024-08-12 DIAGNOSIS — Z85828 Personal history of other malignant neoplasm of skin: Secondary | ICD-10-CM | POA: Diagnosis not present

## 2024-08-12 DIAGNOSIS — Z0000 Encounter for general adult medical examination without abnormal findings: Secondary | ICD-10-CM | POA: Diagnosis not present

## 2024-08-12 DIAGNOSIS — E559 Vitamin D deficiency, unspecified: Secondary | ICD-10-CM | POA: Diagnosis not present
